# Patient Record
Sex: Female | Born: 1995
Health system: Southern US, Community
[De-identification: ages and names within clinical notes are randomized; demographics above are authoritative.]

## PROBLEM LIST (undated history)

## (undated) DIAGNOSIS — G43909 Migraine, unspecified, not intractable, without status migrainosus: Secondary | ICD-10-CM

## (undated) HISTORY — PX: TONSILLECTOMY: SUR1361

## (undated) HISTORY — PX: TYMPANOSTOMY TUBE PLACEMENT: SHX32

---

## 2016-03-30 ENCOUNTER — Emergency Department (HOSPITAL_BASED_OUTPATIENT_CLINIC_OR_DEPARTMENT_OTHER)
Admission: EM | Admit: 2016-03-30 | Discharge: 2016-03-30 | Disposition: A | Discharge status: Home / Self Care | Payer: BLUE CROSS/BLUE SHIELD | Attending: Emergency Medicine | Admitting: Emergency Medicine

## 2016-03-30 ENCOUNTER — Encounter (HOSPITAL_BASED_OUTPATIENT_CLINIC_OR_DEPARTMENT_OTHER): Payer: Self-pay | Admitting: Emergency Medicine

## 2016-03-30 DIAGNOSIS — R109 Unspecified abdominal pain: Secondary | ICD-10-CM

## 2016-03-30 DIAGNOSIS — R110 Nausea: Secondary | ICD-10-CM

## 2016-03-30 DIAGNOSIS — R1030 Lower abdominal pain, unspecified: Secondary | ICD-10-CM | POA: Insufficient documentation

## 2016-03-30 HISTORY — DX: Migraine, unspecified, not intractable, without status migrainosus: G43.909

## 2016-03-30 LAB — URINALYSIS, ROUTINE W REFLEX MICROSCOPIC
Bilirubin Urine: NEGATIVE
GLUCOSE, UA: NEGATIVE mg/dL
HGB URINE DIPSTICK: NEGATIVE
KETONES UR: 15 mg/dL — AB
LEUKOCYTES UA: NEGATIVE
Nitrite: NEGATIVE
PH: 5.5 (ref 5.0–8.0)
PROTEIN: NEGATIVE mg/dL
Specific Gravity, Urine: 1.027 (ref 1.005–1.030)

## 2016-03-30 LAB — CBC WITH DIFFERENTIAL/PLATELET
BASOS ABS: 0 10*3/uL (ref 0.0–0.1)
Basophils Relative: 0 %
EOS PCT: 2 %
Eosinophils Absolute: 0.2 10*3/uL (ref 0.0–0.7)
HEMATOCRIT: 40.4 % (ref 36.0–46.0)
Hemoglobin: 13.6 g/dL (ref 12.0–15.0)
LYMPHS ABS: 2.9 10*3/uL (ref 0.7–4.0)
LYMPHS PCT: 34 %
MCH: 28.2 pg (ref 26.0–34.0)
MCHC: 33.7 g/dL (ref 30.0–36.0)
MCV: 83.6 fL (ref 78.0–100.0)
MONO ABS: 0.8 10*3/uL (ref 0.1–1.0)
MONOS PCT: 9 %
NEUTROS ABS: 4.9 10*3/uL (ref 1.7–7.7)
Neutrophils Relative %: 55 %
PLATELETS: 237 10*3/uL (ref 150–400)
RBC: 4.83 MIL/uL (ref 3.87–5.11)
RDW: 13.1 % (ref 11.5–15.5)
WBC: 8.8 10*3/uL (ref 4.0–10.5)

## 2016-03-30 LAB — COMPREHENSIVE METABOLIC PANEL
ALBUMIN: 4.3 g/dL (ref 3.5–5.0)
ALT: 25 U/L (ref 14–54)
AST: 26 U/L (ref 15–41)
Alkaline Phosphatase: 64 U/L (ref 38–126)
Anion gap: 5 (ref 5–15)
BUN: 17 mg/dL (ref 6–20)
CHLORIDE: 110 mmol/L (ref 101–111)
CO2: 22 mmol/L (ref 22–32)
CREATININE: 0.82 mg/dL (ref 0.44–1.00)
Calcium: 9.1 mg/dL (ref 8.9–10.3)
GFR calc Af Amer: >60 mL/min (ref >60)
GLUCOSE: 104 mg/dL — AB (ref 65–99)
POTASSIUM: 3.5 mmol/L (ref 3.5–5.1)
SODIUM: 137 mmol/L (ref 135–145)
Total Bilirubin: 0.2 mg/dL — ABNORMAL LOW (ref 0.3–1.2)
Total Protein: 7.2 g/dL (ref 6.5–8.1)

## 2016-03-30 LAB — LIPASE, BLOOD: Lipase: 34 U/L (ref 11–51)

## 2016-03-30 LAB — PREGNANCY, URINE: Preg Test, Ur: NEGATIVE

## 2016-03-30 MED ORDER — ONDANSETRON 8 MG PO TBDP
8.0000 mg | ORAL_TABLET | Freq: Once | ORAL | Status: AC
  Administered 2016-03-30: 8 mg via ORAL
  Filled 2016-03-30: qty 1

## 2016-03-30 MED ORDER — ONDANSETRON 8 MG PO TBDP: 8.0000 mg | ORAL_TABLET | Freq: Three times a day (TID) | ORAL | 0 refills | Status: DC | PRN

## 2016-03-30 NOTE — ED Notes (Signed)
Pt back from b/r, alert, NAD, calm, no dyspnea noted, steady gait, family at side, Dr. Preston FleetingGlick in to see pt.

## 2016-03-30 NOTE — ED Provider Notes (Signed)
[facility] Provider Note   CSN: [id number] Arrival date & time: 03/30/16  0543     History   Chief Complaint Chief Complaint  Patient presents with  . Abdominal Pain    HPI Lacey Krueger is a 20 y.o. female.  She woke up at 5 AM and felt like she had to go to the bathroom to urinate. When she met there, she developed nausea and not very sweaty and noted suprapubic pain. There is no radiation of pain. She rates pain at 5/10. Sweating subsided but nausea and pain persisted. She denies fever, chills, sweats. Nothing seems to make symptoms better nothing seems to make them worse. She is unsure when her last menses was because she gets Depo-Provera for contraception.   The history is provided by the patient.  Abdominal Pain      Past Medical History:  Diagnosis Date  . Migraine     There are no active problems to display for this patient.   History reviewed. No pertinent surgical history.  OB History    No data available       Home Medications    Prior to Admission medications   Not on File    Family History No family history on file.  Social History Social History  Substance Use Topics  . Smoking status: Never Smoker  . Smokeless tobacco: Never Used  . Alcohol use No     Allergies   Patient has no known allergies.   Review of Systems Review of Systems  Gastrointestinal: Positive for abdominal pain.  All other systems reviewed and are negative.    Physical Exam Updated Vital Signs BP 129/80 (BP Location: Right Arm)   Temp 98.9 F (37.2 C) (Oral)   Resp 21   Ht '5\' 3"'  (1.6 m)   Wt 190 lb (86.2 kg)   LMP  (LMP Unknown) Comment: on depo provera  SpO2 99%   BMI 33.66 kg/m   Physical Exam  Nursing note and vitals reviewed.  20 year old female, resting comfortably and in no acute distress. Vital signs are normal. Oxygen saturation is 99%, which is normal. Head is normocephalic and atraumatic. PERRLA, EOMI. Oropharynx is  clear. Neck is nontender and supple without adenopathy or JVD. Back is nontender and there is no CVA tenderness. Lungs are clear without rales, wheezes, or rhonchi. Chest is nontender. Heart has regular rate and rhythm without murmur. Abdomen is soft, flat, with very mild suprapubic tenderness. There is no rebound or guarding. There are no masses or hepatosplenomegaly and peristalsis is hypoactive. Extremities have no cyanosis or edema, full range of motion is present. Skin is warm and dry without rash. Neurologic: Mental status is normal, cranial nerves are intact, there are no motor or sensory deficits.  ED Treatments / Results  Labs (all labs ordered are listed, but only abnormal results are displayed) Labs Reviewed  COMPREHENSIVE METABOLIC PANEL - Abnormal; Notable for the following:       Result Value   Glucose, Bld 104 (*)    Total Bilirubin 0.2 (*)    All other components within normal limits  URINALYSIS, ROUTINE W REFLEX MICROSCOPIC (NOT AT [facility]) - Abnormal; Notable for the following:    Ketones, ur 15 (*)    All other components within normal limits  LIPASE, BLOOD  CBC WITH DIFFERENTIAL/PLATELET  PREGNANCY, URINE    Procedures Procedures (including critical care time)  Medications Ordered in ED Medications  ondansetron (ZOFRAN-ODT) disintegrating tablet 8 mg (not administered)  Initial Impression / Assessment and Plan / ED Course  I have reviewed the triage vital signs and the nursing notes.  Pertinent lab results that were available during my care of the patient were reviewed by me and considered in my medical decision making (see chart for details).  Clinical Course    Abdominal pain with nausea. Exam is not worrisome for severe pathology. Some of nausea and diaphoresis appears to be vagal response. Old records are reviewed, and she has no relevant past visits. We'll check urinalysis and screening labs. She is given a dose of ondansetron for persistent  nausea.  Laboratory workup is reassuring. Only positive finding is small ketones in the urine. Patient is reevaluated and she says nausea has completely resolved and pain has completely resolved. She now tells me that she has had several other episodes with nausea, sweating, lightheadedness over the past several years. This clearly sounds like vasovagal episodes. I discussed this with patient and family. At this point, no indication for any kind of imaging. Return precautions were discussed. She is given a prescription for ondansetron oral dissolving tablet to use as needed nausea recur.  Final Clinical Impressions(s) / ED Diagnoses   Final diagnoses:  Abdominal pain, unspecified abdominal location  Nausea    New Prescriptions New Prescriptions   ONDANSETRON (ZOFRAN-ODT) 8 MG DISINTEGRATING TABLET    Take 1 tablet (8 mg total) by mouth every 8 (eight) hours as needed for nausea or vomiting.     Delora Fuel, MD 14/23/95 3202

## 2016-03-30 NOTE — ED Notes (Signed)
tolerating POs

## 2016-03-30 NOTE — ED Triage Notes (Signed)
Pt states she woke up about an hour ago "felt hot and sweaty". Attempted to lay back down and felt nauseated and "like there's a lump in my stomach". Denies other symptoms.

## 2018-03-08 ENCOUNTER — Other Ambulatory Visit: Payer: Self-pay

## 2018-03-08 ENCOUNTER — Emergency Department (HOSPITAL_BASED_OUTPATIENT_CLINIC_OR_DEPARTMENT_OTHER)
Admission: EM | Admit: 2018-03-08 | Discharge: 2018-03-09 | Disposition: A | Discharge status: Home / Self Care | Payer: BLUE CROSS/BLUE SHIELD | Attending: Emergency Medicine | Admitting: Emergency Medicine

## 2018-03-08 ENCOUNTER — Encounter (HOSPITAL_BASED_OUTPATIENT_CLINIC_OR_DEPARTMENT_OTHER): Payer: Self-pay | Admitting: *Deleted

## 2018-03-08 DIAGNOSIS — R102 Pelvic and perineal pain: Secondary | ICD-10-CM | POA: Insufficient documentation

## 2018-03-08 NOTE — ED Triage Notes (Signed)
Pt c/o pelvic pain x 1 day , denies discharge or urinary symptoms

## 2018-03-09 DIAGNOSIS — R102 Pelvic and perineal pain: Secondary | ICD-10-CM | POA: Diagnosis not present

## 2018-03-09 LAB — WET PREP, GENITAL
Clue Cells Wet Prep HPF POC: NONE SEEN
SPERM: NONE SEEN
Trich, Wet Prep: NONE SEEN
Yeast Wet Prep HPF POC: NONE SEEN

## 2018-03-09 LAB — PREGNANCY, URINE: PREG TEST UR: NEGATIVE

## 2018-03-09 LAB — URINALYSIS, ROUTINE W REFLEX MICROSCOPIC
BILIRUBIN URINE: NEGATIVE
Glucose, UA: NEGATIVE mg/dL
Hgb urine dipstick: NEGATIVE
KETONES UR: 15 mg/dL — AB
NITRITE: NEGATIVE
PH: 6.5 (ref 5.0–8.0)
Protein, ur: NEGATIVE mg/dL
SPECIFIC GRAVITY, URINE: 1.025 (ref 1.005–1.030)

## 2018-03-09 LAB — URINALYSIS, MICROSCOPIC (REFLEX)

## 2018-03-09 NOTE — Discharge Instructions (Addendum)
You may alternate Tylenol 1000 mg every 6 hours as needed for pain and Ibuprofen 800 mg every 8 hours as needed for pain.  Please take Ibuprofen with food.   Center for [facility] [address] [place], [place] [phone](336) [phone]  [facility] [address] [place], [place] [phone]) [phone]   [facility] [address] [place], [place] [phone]  [facility] [address] [place], [place] [phone]   [facility] [address] [place], [place] [phone]   [facility] [address] [place], [place] [phone](336) [phone]   [facility] [address] [place], [place] [phone](336) [phone]   [facility] [address] [place], [place] [phone]

## 2018-03-09 NOTE — ED Notes (Signed)
ED Provider at bedside. 

## 2018-03-09 NOTE — ED Notes (Signed)
C/o lower pelvic pain today. Denies discharge. Pt does not appear in any distress.

## 2018-03-09 NOTE — ED Provider Notes (Signed)
TIME SEEN: 12:06 AM  CHIEF COMPLAINT: Suprapubic abdominal pain  HPI: Patient is a 22 year old female with history of migraines who just finished antibiotics for UTI who presents to the emergency department with suprapubic pain that started this afternoon when she was stretching.  She denies fevers, nausea, vomiting, diarrhea, dysuria, hematuria, vaginal bleeding or discharge.  She is sexually active but no history of STD or pregnancy.  Last menstrual period was October 22.  No previous history of abdominal surgery.  ROS: See HPI Constitutional: no fever  Eyes: no drainage  ENT: no runny nose   Cardiovascular:  no chest pain  Resp: no SOB  GI: no vomiting GU: no dysuria Integumentary: no rash  Allergy: no hives  Musculoskeletal: no leg swelling  Neurological: no slurred speech ROS otherwise negative  PAST MEDICAL HISTORY/PAST SURGICAL HISTORY:  Past Medical History:  Diagnosis Date  . Migraine     MEDICATIONS:  Prior to Admission medications   Not on File    ALLERGIES:  No Known Allergies  SOCIAL HISTORY:  Social History   Tobacco Use  . Smoking status: Never Smoker  . Smokeless tobacco: Never Used  Substance Use Topics  . Alcohol use: No    FAMILY HISTORY: History reviewed. No pertinent family history.  EXAM: BP 111/71 (BP Location: Left Arm)   Pulse 71   Temp 99.1 F (37.3 C) (Oral)   Resp 18   Ht 5\' 3"  (1.6 m)   Wt 97.5 kg   LMP 02/23/2018   SpO2 99%   BMI 38.09 kg/m  CONSTITUTIONAL: Alert and oriented and responds appropriately to questions. Well-appearing; well-nourished HEAD: Normocephalic EYES: Conjunctivae clear, pupils appear equal, EOMI ENT: normal nose; moist mucous membranes NECK: Supple, no meningismus, no nuchal rigidity, no LAD  CARD: RRR; S1 and S2 appreciated; no murmurs, no clicks, no rubs, no gallops RESP: Normal chest excursion without splinting or tachypnea; breath sounds clear and equal bilaterally; no wheezes, no rhonchi, no  rales, no hypoxia or respiratory distress, speaking full sentences ABD/GI: Normal bowel sounds; non-distended; soft, non-tender, no tenderness at McBurney's point, no rebound, no guarding, no peritoneal signs, no hepatosplenomegaly BACK:  The back appears normal and is non-tender to palpation, there is no CVA tenderness EXT: Normal ROM in all joints; non-tender to palpation; no edema; normal capillary refill; no cyanosis, no calf tenderness or swelling    SKIN: Normal color for age and race; warm; no rash NEURO: Moves all extremities equally PSYCH: The patient's mood and manner are appropriate. Grooming and personal hygiene are appropriate.  MEDICAL DECISION MAKING: Patient here with suprapubic pain that occurred while stretching today.  Her abdominal exam currently is benign.  No tenderness at McBurney's point.  Mother's biggest concern was appendicitis.  Low suspicion for this today.  She has not had any fevers, vomiting.  Urine does show trace leukocytes and many bacteria but no other sign of infection.  She just finished antibiotics for UTI.  Pregnancy test is negative.  Patient's pelvic exam is unremarkable.  Wet prep shows no significant abnormality.  Discussed with family that this could be residual pain from recent UTI.  Nothing on examination to suggest torsion, tubo-ovarian abscess, appendicitis, PID.  Pregnancy test negative ruling out ectopic.  Given benign abdominal exam I feel she is safe to go home without further emergent work-up.  I have provided them with outpatient OB/GYN follow-up if symptoms are not improving or are worsening.  We did discuss return precautions.  Recommended alternating Tylenol and  Motrin as needed for pain.  At this time, I do not feel there is any life-threatening condition present. I have reviewed and discussed all results (EKG, imaging, lab, urine as appropriate) and exam findings with patient/family. I have reviewed nursing notes and appropriate previous records.   I feel the patient is safe to be discharged home without further emergent workup and can continue workup as an outpatient as needed. Discussed usual and customary return precautions. Patient/family verbalize understanding and are comfortable with this plan.  Outpatient follow-up has been provided if needed. All questions have been answered.      Donnalee Cellucci, Layla Maw, DO 03/09/18 [phone]

## 2018-03-10 LAB — GC/CHLAMYDIA PROBE AMP (~~LOC~~) NOT AT ARMC
CHLAMYDIA, DNA PROBE: NEGATIVE
Neisseria Gonorrhea: NEGATIVE

## 2019-08-16 ENCOUNTER — Emergency Department (HOSPITAL_BASED_OUTPATIENT_CLINIC_OR_DEPARTMENT_OTHER): Payer: BC Managed Care – PPO

## 2019-08-16 ENCOUNTER — Emergency Department (HOSPITAL_BASED_OUTPATIENT_CLINIC_OR_DEPARTMENT_OTHER)
Admission: EM | Admit: 2019-08-16 | Discharge: 2019-08-16 | Disposition: A | Discharge status: Home / Self Care | Payer: BC Managed Care – PPO | Attending: Emergency Medicine | Admitting: Emergency Medicine

## 2019-08-16 ENCOUNTER — Encounter (HOSPITAL_BASED_OUTPATIENT_CLINIC_OR_DEPARTMENT_OTHER): Payer: Self-pay

## 2019-08-16 ENCOUNTER — Other Ambulatory Visit: Payer: Self-pay

## 2019-08-16 DIAGNOSIS — M25561 Pain in right knee: Secondary | ICD-10-CM | POA: Insufficient documentation

## 2019-08-16 DIAGNOSIS — R0789 Other chest pain: Secondary | ICD-10-CM | POA: Diagnosis not present

## 2019-08-16 MED ORDER — NAPROXEN 500 MG PO TABS: 500.0000 mg | ORAL_TABLET | Freq: Two times a day (BID) | ORAL | 0 refills | Status: AC

## 2019-08-16 MED ORDER — METHOCARBAMOL 500 MG PO TABS: 500.0000 mg | ORAL_TABLET | Freq: Two times a day (BID) | ORAL | 0 refills | Status: AC

## 2019-08-16 MED FILL — METHOCARBAMOL 500 MG TABLET: 500 | 10 days supply | Qty: 20 | Fill #0

## 2019-08-16 MED FILL — NAPROXEN 500 MG TABS: 500 | 15 days supply | Qty: 30 | Fill #0

## 2019-08-16 NOTE — ED Notes (Signed)
ED Provider at bedside. 
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
## 2021-04-06 ENCOUNTER — Encounter (HOSPITAL_BASED_OUTPATIENT_CLINIC_OR_DEPARTMENT_OTHER): Payer: Self-pay | Admitting: *Deleted

## 2021-04-06 ENCOUNTER — Emergency Department (HOSPITAL_BASED_OUTPATIENT_CLINIC_OR_DEPARTMENT_OTHER): Payer: BC Managed Care – PPO

## 2021-04-06 ENCOUNTER — Other Ambulatory Visit: Payer: Self-pay

## 2021-04-06 ENCOUNTER — Emergency Department (HOSPITAL_BASED_OUTPATIENT_CLINIC_OR_DEPARTMENT_OTHER)
Admission: EM | Admit: 2021-04-06 | Discharge: 2021-04-06 | Disposition: A | Discharge status: Home / Self Care | Payer: BC Managed Care – PPO | Attending: Emergency Medicine | Admitting: Emergency Medicine

## 2021-04-06 DIAGNOSIS — R072 Precordial pain: Secondary | ICD-10-CM | POA: Diagnosis not present

## 2021-04-06 DIAGNOSIS — M25512 Pain in left shoulder: Secondary | ICD-10-CM | POA: Diagnosis present

## 2021-04-06 LAB — CBC WITH DIFFERENTIAL/PLATELET
Abs Immature Granulocytes: 0.02 10*3/uL (ref 0.00–0.07)
Basophils Absolute: 0 10*3/uL (ref 0.0–0.1)
Basophils Relative: 0 %
Eosinophils Absolute: 0.1 10*3/uL (ref 0.0–0.5)
Eosinophils Relative: 1 %
HCT: 40 % (ref 36.0–46.0)
Hemoglobin: 13 g/dL (ref 12.0–15.0)
Immature Granulocytes: 0 %
Lymphocytes Relative: 32 %
Lymphs Abs: 3 10*3/uL (ref 0.7–4.0)
MCH: 27.4 pg (ref 26.0–34.0)
MCHC: 32.5 g/dL (ref 30.0–36.0)
MCV: 84.2 fL (ref 80.0–100.0)
Monocytes Absolute: 0.6 10*3/uL (ref 0.1–1.0)
Monocytes Relative: 6 %
Neutro Abs: 5.8 10*3/uL (ref 1.7–7.7)
Neutrophils Relative %: 61 %
Platelets: 289 10*3/uL (ref 150–400)
RBC: 4.75 MIL/uL (ref 3.87–5.11)
RDW: 13.6 % (ref 11.5–15.5)
WBC: 9.5 10*3/uL (ref 4.0–10.5)
nRBC: 0 % (ref 0.0–0.2)

## 2021-04-06 LAB — COMPREHENSIVE METABOLIC PANEL
ALT: 15 U/L (ref 0–44)
AST: 21 U/L (ref 15–41)
Albumin: 4.5 g/dL (ref 3.5–5.0)
Alkaline Phosphatase: 50 U/L (ref 38–126)
Anion gap: 8 (ref 5–15)
BUN: 15 mg/dL (ref 6–20)
CO2: 24 mmol/L (ref 22–32)
Calcium: 9.5 mg/dL (ref 8.9–10.3)
Chloride: 105 mmol/L (ref 98–111)
Creatinine, Ser: 0.79 mg/dL (ref 0.44–1.00)
GFR, Estimated: >60 mL/min (ref >60)
Glucose, Bld: 98 mg/dL (ref 70–99)
Potassium: 4 mmol/L (ref 3.5–5.1)
Sodium: 137 mmol/L (ref 135–145)
Total Bilirubin: 0.4 mg/dL (ref 0.3–1.2)
Total Protein: 7.9 g/dL (ref 6.5–8.1)

## 2021-04-06 LAB — TROPONIN I (HIGH SENSITIVITY): Troponin I (High Sensitivity): <2 ng/L (ref <18)

## 2021-04-06 MED ORDER — DICLOFENAC SODIUM 1 % EX GEL: 2.0000 g | Freq: Four times a day (QID) | CUTANEOUS | 0 refills | Status: AC | PRN

## 2021-04-06 MED ORDER — IBUPROFEN 800 MG PO TABS: 800.0000 mg | ORAL_TABLET | Freq: Three times a day (TID) | ORAL | 0 refills | Status: AC | PRN

## 2021-04-06 NOTE — ED Provider Notes (Signed)
Emergency Department Provider Note   I have reviewed the triage vital signs and the nursing notes.   HISTORY  Chief Complaint Shoulder Pain   HPI Lacey Krueger is a 25 y.o. female with past history reviewed below presents to the emergency department with pain in the left shoulder and chest.  She has felt the pain in the past month but has become more significant over the past couple of days.  The pain is in the shoulder but also the central chest.  It is worse with certain movements.  Patient does work as a Engineer, civil (consulting) but does not recall any particular injury although notes she does move patients frequently during her job.  She is not having any numbness or weakness.  No pleuritic pain, shortness of breath, fever/chills.  She has not taken anything for pain over-the-counter.    Past Medical History:  Diagnosis Date   Migraine     There are no problems to display for this patient.   Past Surgical History:  Procedure Laterality Date   TONSILLECTOMY     TYMPANOSTOMY TUBE PLACEMENT      Allergies Patient has no known allergies.  No family history on file.  Social History Social History   Tobacco Use   Smoking status: Never   Smokeless tobacco: Never  Vaping Use   Vaping Use: Never used  Substance Use Topics   Alcohol use: Yes   Drug use: No    Review of Systems  Constitutional: No fever/chills Eyes: No visual changes. ENT: No sore throat. Cardiovascular: Positive chest pain. Respiratory: Denies shortness of breath. Gastrointestinal: No abdominal pain.  No nausea, no vomiting.  No diarrhea.  No constipation. Genitourinary: Negative for dysuria. Musculoskeletal: Negative for back pain. Positive left shoulder pain.  Skin: Negative for rash. Neurological: Negative for headaches, focal weakness or numbness.  10-point ROS otherwise negative.  ____________________________________________   PHYSICAL EXAM:  VITAL SIGNS: ED Triage Vitals  Enc Vitals Group     BP  04/06/21 1435 117/73     Pulse Rate 04/06/21 1435 84     Resp 04/06/21 1435 16     Temp 04/06/21 1435 98.9 F (37.2 C)     Temp Source 04/06/21 1435 Oral     SpO2 04/06/21 1435 100 %     Weight 04/06/21 1430 205 lb (93 kg)     Height 04/06/21 1430 5\' 3"  (1.6 m)    Constitutional: Alert and oriented. Well appearing and in no acute distress. Eyes: Conjunctivae are normal.  Head: Atraumatic. Nose: No congestion/rhinnorhea. Mouth/Throat: Mucous membranes are moist.   Neck: No stridor.  Cardiovascular: Normal rate, regular rhythm. Good peripheral circulation. Grossly normal heart sounds.   Respiratory: Normal respiratory effort.  No retractions. Lungs CTAB. Gastrointestinal: Soft and nontender. No distention.  Musculoskeletal: No lower extremity tenderness nor edema. No gross deformities of extremities.  Mild tenderness to the anterior left shoulder without erythema or swelling.  Pain slightly worse with extension beyond 90 degrees.  No chest wall tenderness. Neurologic:  Normal speech and language. No gross focal neurologic deficits are appreciated.  Skin:  Skin is warm, dry and intact. No rash noted.  ____________________________________________   LABS (all labs ordered are listed, but only abnormal results are displayed)  Labs Reviewed  COMPREHENSIVE METABOLIC PANEL  CBC WITH DIFFERENTIAL/PLATELET  HCG, SERUM, QUALITATIVE  TROPONIN I (HIGH SENSITIVITY)  TROPONIN I (HIGH SENSITIVITY)   ____________________________________________  EKG   EKG Interpretation  Date/Time:  Saturday April 06 2021 14:33:29 EST  Ventricular Rate:  83 PR Interval:  148 QRS Duration: 88 QT Interval:  364 QTC Calculation: 427 R Axis:   58 Text Interpretation: Sinus rhythm with marked sinus arrhythmia Otherwise normal ECG Confirmed by Alona Bene [phone]) on 04/06/2021 3:21:41 PM        ____________________________________________  RADIOLOGY  DG Chest 2 View  Result Date:  04/06/2021 CLINICAL DATA:  Chest pain EXAM: CHEST - 2 VIEW COMPARISON:  None. FINDINGS: The heart size and mediastinal contours are within normal limits. Both lungs are clear. No pleural effusion or pneumothorax. The visualized skeletal structures are unremarkable. IMPRESSION: No active cardiopulmonary disease. Electronically Signed   By: Guadlupe Spanish M.D.   On: 04/06/2021 15:56   DG Shoulder Left  Result Date: 04/06/2021 CLINICAL DATA:  Chest pain EXAM: LEFT SHOULDER - 2+ VIEW COMPARISON:  None. FINDINGS: There is no evidence of fracture or dislocation. There is no evidence of arthropathy or other focal bone abnormality. Soft tissues are unremarkable. IMPRESSION: Negative. Electronically Signed   By: Charlett Nose M.D.   On: 04/06/2021 15:49    ____________________________________________   PROCEDURES  Procedure(s) performed:   Procedures  None  ____________________________________________   INITIAL IMPRESSION / ASSESSMENT AND PLAN / ED COURSE  Pertinent labs & imaging results that were available during my care of the patient were reviewed by me and considered in my medical decision making (see chart for details).   Patient presents emergency department for evaluation of chest and left shoulder discomfort.  Seems musculoskeletal but will perform baseline labs including troponin.  Considered PE but feel this is very unlikely and patient is low risk by [facility] and PERC negative.   Troponin is negative.  The patient's lab work here is reassuring.  Pain seems musculoskeletal.  Discussed anti-inflammatories both p.o. and topical.  Will follow with her PCP.  Provided a work note for return but will not lift over 20 pounds for the next week.  ____________________________________________  FINAL CLINICAL IMPRESSION(S) / ED DIAGNOSES  Final diagnoses:  Acute pain of left shoulder  Precordial chest pain    NEW OUTPATIENT MEDICATIONS STARTED DURING THIS VISIT:  Discharge Medication List as  of 04/06/2021  5:10 PM     START taking these medications   Details  diclofenac Sodium (VOLTAREN) 1 % GEL Apply 2 g topically 4 (four) times daily as needed., Starting Sat 04/06/2021, Normal    ibuprofen (ADVIL) 800 MG tablet Take 1 tablet (800 mg total) by mouth every 8 (eight) hours as needed., Starting Sat 04/06/2021, Normal        Note:  This document was prepared using Dragon voice recognition software and may include unintentional dictation errors.  Alona Bene, MD, [facility]    Esperanza Madrazo, Arlyss Repress, MD 04/06/21 [phone]

## 2021-04-06 NOTE — Discharge Instructions (Signed)

## 2021-04-06 NOTE — ED Notes (Signed)
Pt moved to monitored bed for cardiac workup

## 2021-04-06 NOTE — ED Triage Notes (Signed)
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 States pain is in her left shoulder and left side of her chest
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
## 2022-12-03 IMAGING — DX DG SHOULDER 2+V*L*
3 series · 3 of 3 positions shown · non-contrast
Comparison: None.

CLINICAL DATA: Chest pain

EXAM:
LEFT SHOULDER - 2+ VIEW

[shoulder grashey]
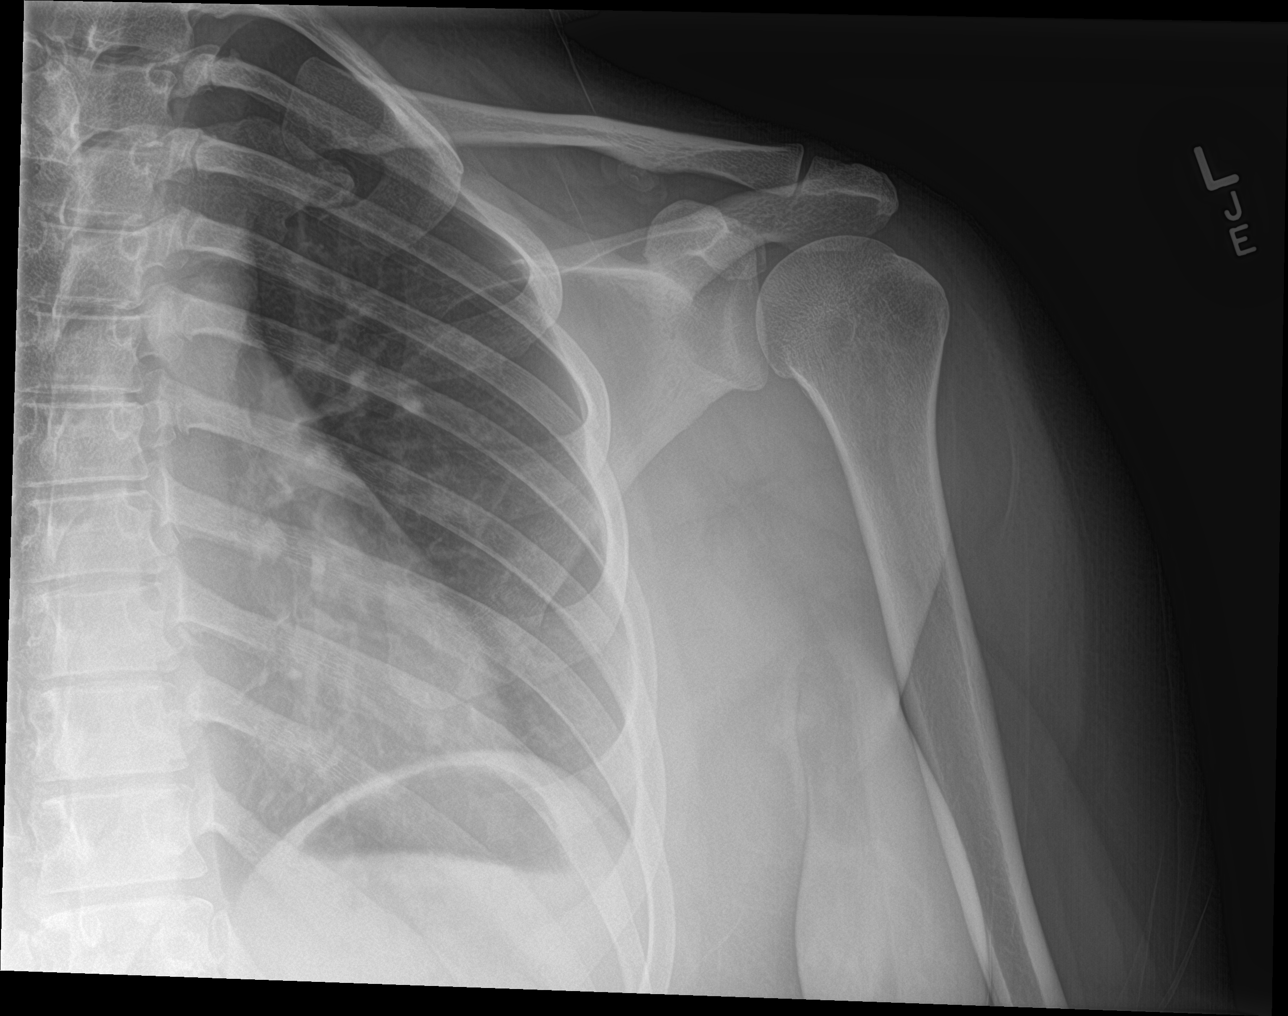

[shoulder y view]
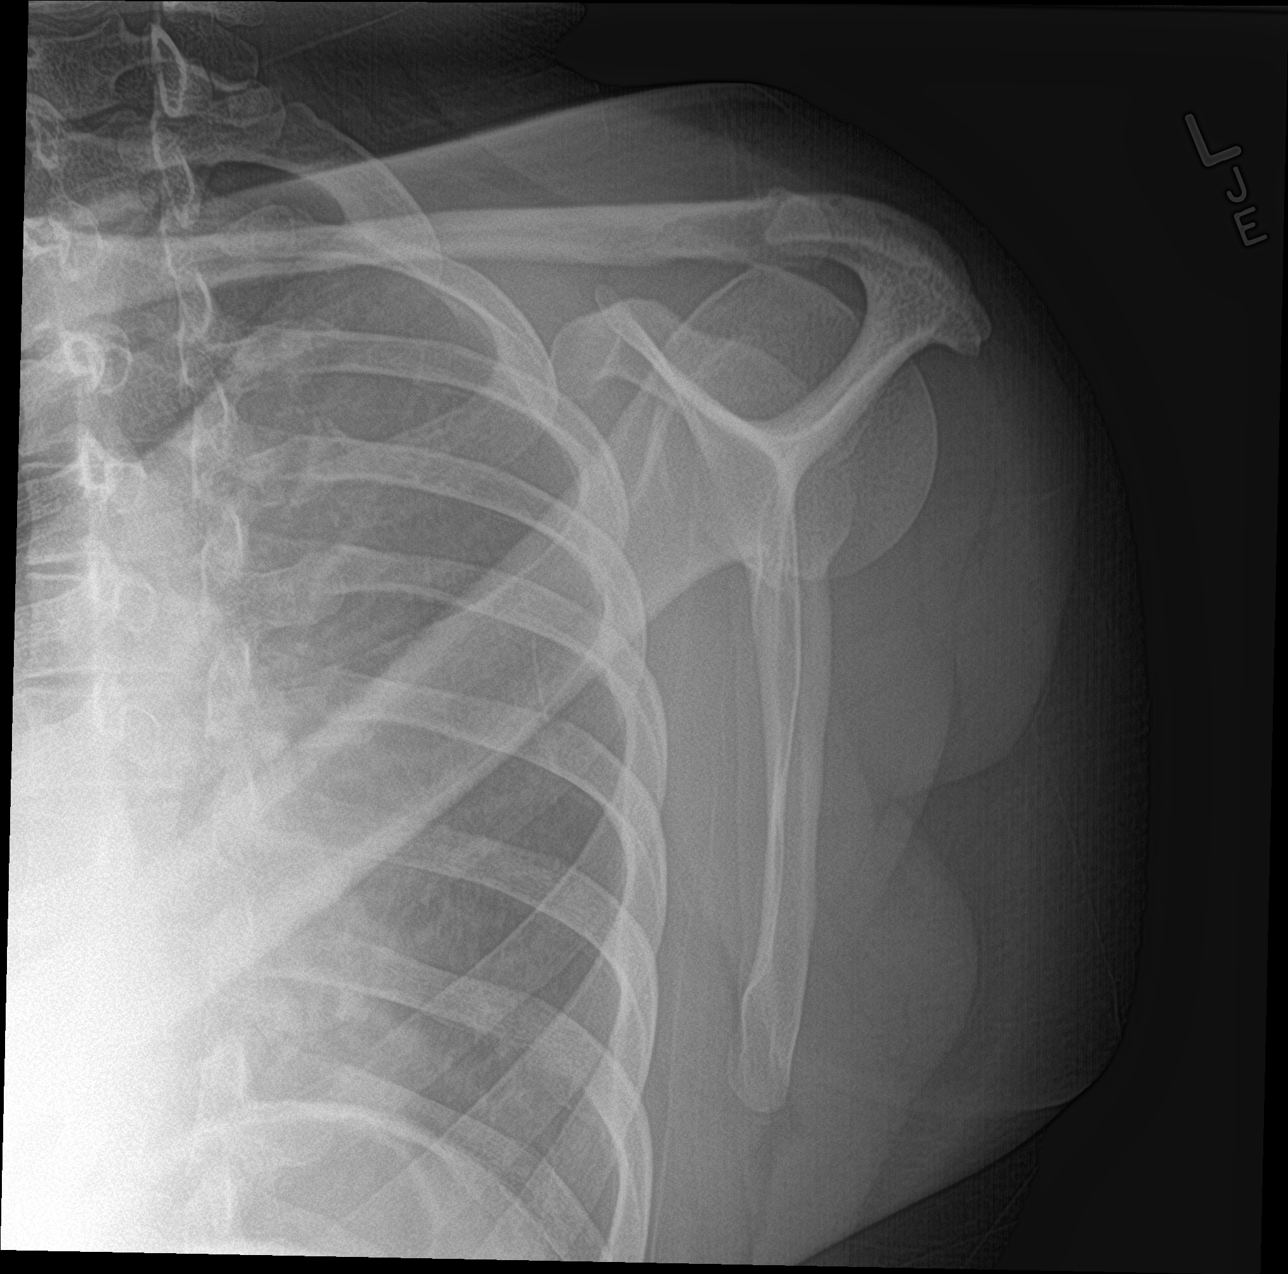

[shoulder axillary]
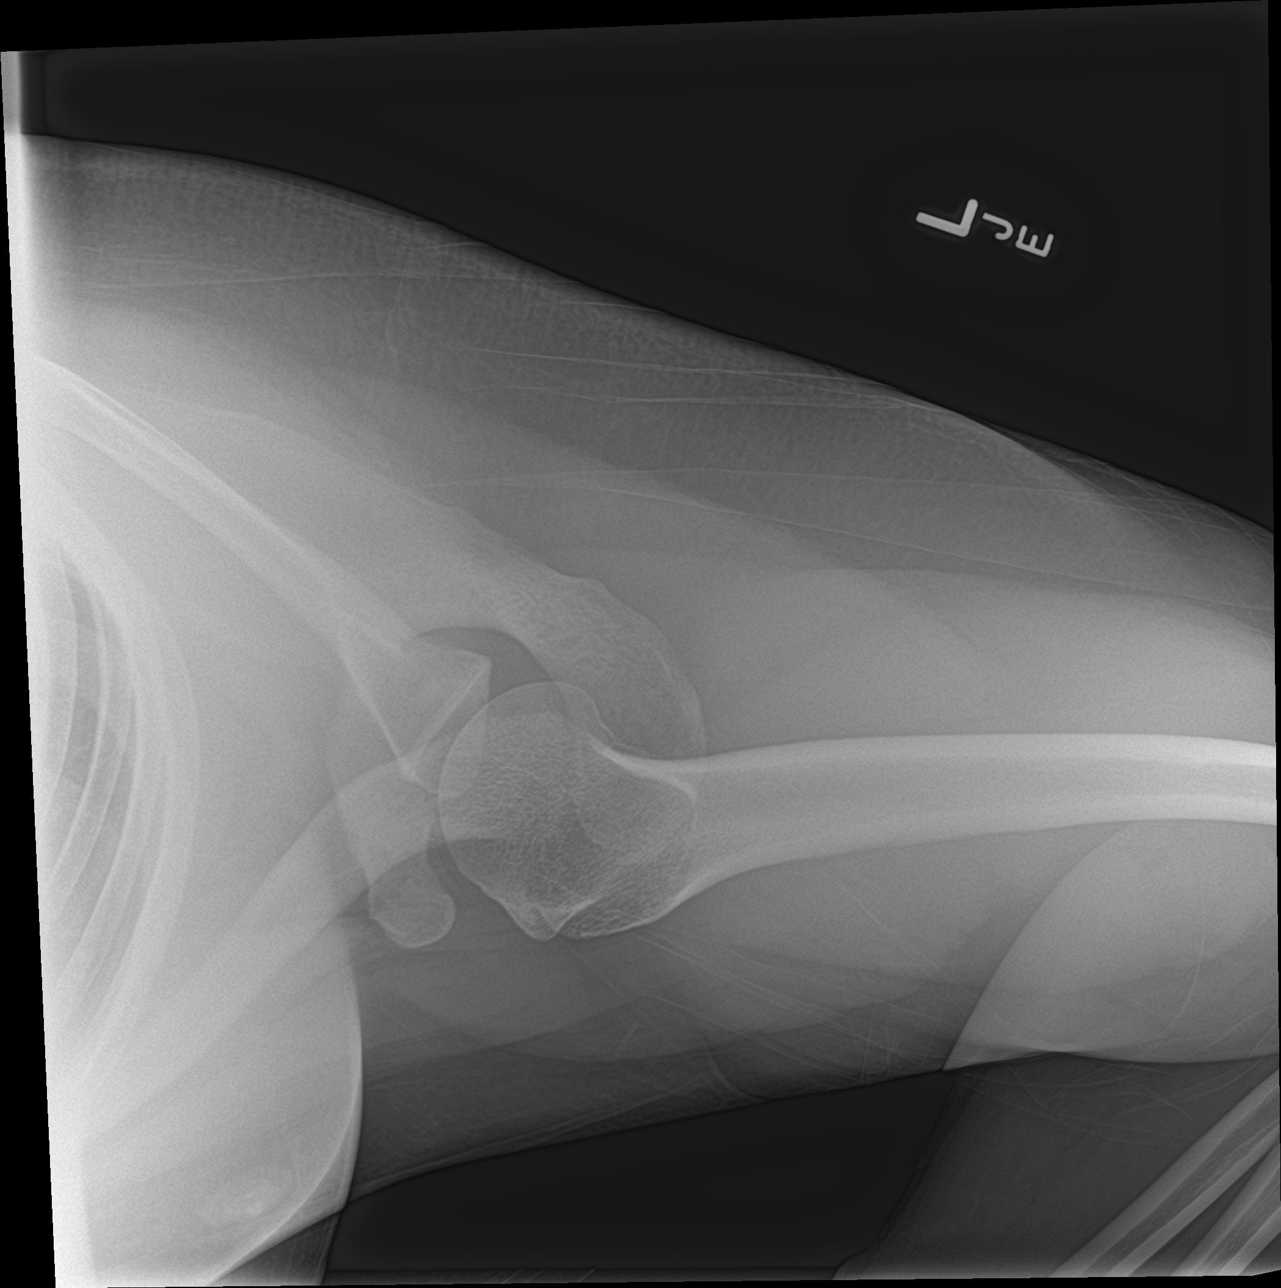

[3 of 3 positions shown; findings below may reference images not displayed]

FINDINGS: There is no evidence of fracture or dislocation. There is no
evidence of arthropathy or other focal bone abnormality. Soft
tissues are unremarkable.
IMPRESSION: Negative.
